# Patient Record
Sex: Male | Born: 1950 | Race: White | Hispanic: No | Smoking: Never smoker
Health system: Southern US, Community
[De-identification: ages and names within clinical notes are randomized; demographics above are authoritative.]

## PROBLEM LIST (undated history)

## (undated) DIAGNOSIS — I629 Nontraumatic intracranial hemorrhage, unspecified: Secondary | ICD-10-CM

## (undated) DIAGNOSIS — F1011 Alcohol abuse, in remission: Secondary | ICD-10-CM

## (undated) DIAGNOSIS — F418 Other specified anxiety disorders: Secondary | ICD-10-CM

## (undated) DIAGNOSIS — F0390 Unspecified dementia without behavioral disturbance: Secondary | ICD-10-CM

---

## 2020-09-25 ENCOUNTER — Emergency Department
Admission: EM | Admit: 2020-09-25 | Discharge: 2020-09-25 | Disposition: A | Discharge status: Home / Self Care | Payer: Medicare Other | Attending: Emergency Medicine | Admitting: Emergency Medicine

## 2020-09-25 ENCOUNTER — Emergency Department: Payer: Medicare Other

## 2020-09-25 ENCOUNTER — Other Ambulatory Visit: Payer: Self-pay

## 2020-09-25 DIAGNOSIS — Y92129 Unspecified place in nursing home as the place of occurrence of the external cause: Secondary | ICD-10-CM | POA: Diagnosis not present

## 2020-09-25 DIAGNOSIS — W050XXA Fall from non-moving wheelchair, initial encounter: Secondary | ICD-10-CM | POA: Diagnosis not present

## 2020-09-25 DIAGNOSIS — S0990XA Unspecified injury of head, initial encounter: Secondary | ICD-10-CM | POA: Diagnosis present

## 2020-09-25 DIAGNOSIS — W19XXXA Unspecified fall, initial encounter: Secondary | ICD-10-CM

## 2020-09-25 LAB — CBG MONITORING, ED: Glucose-Capillary: 105 mg/dL — ABNORMAL HIGH (ref 70–99)

## 2020-09-25 NOTE — ED Provider Notes (Signed)
[facility] Emergency Department Provider Note   ____________________________________________    I have reviewed the triage vital signs and the nursing notes.   HISTORY  Chief Complaint Fall  [facility] from [facility], history limited   HPI Brad Douglas is a 70 y.o. male who presents after reported fall from wheelchair.  EMS notes that nursing home staff reports the patient slipped out of his wheelchair and fell and may have hit his head.  The patient has no complaints.  He denies shoulder pain to me.  No abdominal pain chest pain back pain or neck pain.  He does not think he hit his head.  No nausea or vomiting.  No past medical history on file.  There are no problems to display for this patient.     Prior to Admission medications   Not on File     Allergies Patient has no known allergies.  No family history on file.  Social History    Review of Systems  Constitutional: No dizziness Eyes: No visual changes.  ENT: No epistaxis Cardiovascular: Denies chest pain. Respiratory: No cough Gastrointestinal: No abdominal pain.  No nausea, no vomiting.   Genitourinary: No groin injury Musculoskeletal: As above Skin: Negative for laceration Neurological: Negative for headaches    ____________________________________________   PHYSICAL EXAM:  VITAL SIGNS: ED Triage Vitals  Enc Vitals Group     BP 09/25/20 0358 112/66     Pulse Rate 09/25/20 0358 79     Resp 09/25/20 0358 13     Temp 09/25/20 0358 97.8 F (36.6 C)     Temp Source 09/25/20 0358 Rectal     SpO2 09/25/20 0358 98 %     Weight 09/25/20 0402 108.9 kg (240 lb)     Height 09/25/20 0402 1.956 m (6\' 5" )     Head Circumference --      Peak Flow --      Pain Score --      Pain Loc --      Pain Edu? --      Excl. in GC? --     Constitutional: Alert, no acute distress Eyes: Conjunctivae are normal.  Head: No hematoma noted, healing laceration, staples in  place Nose: No swelling or epistaxis Mouth/Throat: Mucous membranes are moist.   Neck:  Painless ROM, no pain with axial load, no vertebral tenderness to palpation Cardiovascular:  Grossly normal heart sounds.  Good peripheral circulation.  No chest wall tenderness palpation Respiratory: Normal respiratory effort.  No retractions. Lungs CTAB. Gastrointestinal: Soft and nontender. No distention.  No CVA tenderness.  Musculoskeletal: Normal range of motion of all extremities, no tenderness of the back or chest wall, no pain with axial load on both hips Neurologic:  Normal speech and language. No gross focal neurologic deficits are appreciated.  Skin:  Skin is warm, dry and intact. No rash noted. Psychiatric: Mood and affect are normal. Speech and behavior are normal.  ____________________________________________   LABS (all labs ordered are listed, but only abnormal results are displayed)  Labs Reviewed  CBG MONITORING, ED - Abnormal; Notable for the following components:      Result Value   Glucose-Capillary 105 (*)    All other components within normal limits   ____________________________________________  EKG  None ____________________________________________  RADIOLOGY  CT head reviewed by me, no acute abnormality ____________________________________________   PROCEDURES  Procedure(s) performed: No  Procedures   Critical Care performed: No ____________________________________________   INITIAL IMPRESSION /  ASSESSMENT AND PLAN / ED COURSE  Pertinent labs & imaging results that were available during my care of the patient were reviewed by me and considered in my medical decision making (see chart for details).  Patient presents after a fall as reported.  Exam is overall reassuring.  CT head is unremarkable  Removed 8 staples from old laceration patient had over a week ago, well-healed wound    ____________________________________________   FINAL CLINICAL  IMPRESSION(S) / ED DIAGNOSES  Final diagnoses:  Fall, initial encounter  Injury of head, initial encounter        Note:  This document was prepared using Dragon voice recognition software and may include unintentional dictation errors.   Jene Every, MD 09/25/20 [phone]

## 2020-09-25 NOTE — ED Triage Notes (Signed)
Pt via EMS from [facility]. Pt fell back out of his wheelchair. Pt hit the back of his head. EMS reports L shoulder pain and lower back pain. On arrival, pt has no complaints and able to move L shoulder freely. Pt did have some staples placed on the R posterior head. Pt is alert but only oriented self, situation at baseline.

## 2020-09-25 NOTE — ED Notes (Signed)
Discharge instructions given to brother who verbalized understanding.

## 2020-09-25 NOTE — ED Notes (Signed)
Pt presents to the ED after a mechanical fall. Pt sits in a reclining wheelchair and per staff the back of chair "gave out" and the pt fell backwards. Pt did hit the back of his head. Noted that pt has recent staples placed. Staples are intact. Per EMS, staff reported lower back pain and L shoulder pain. On assessment, Dr. Cyril Loosen performed full ROM and pt did not express any pain. Pt is alert but only oriented to self and situation at baseline.

## 2020-09-25 NOTE — Discharge Instructions (Addendum)
We removed the staples from an old scalp laceration that had healed nicely

## 2021-09-03 ENCOUNTER — Emergency Department: Payer: Medicare Other

## 2021-09-03 ENCOUNTER — Observation Stay
Admission: EM | Admit: 2021-09-03 | Discharge: 2021-09-03 | Disposition: A | Discharge status: Home / Self Care | Payer: Medicare Other | Attending: Internal Medicine | Admitting: Internal Medicine

## 2021-09-03 ENCOUNTER — Other Ambulatory Visit: Payer: Self-pay

## 2021-09-03 ENCOUNTER — Encounter: Payer: Self-pay | Admitting: Radiology

## 2021-09-03 DIAGNOSIS — R4182 Altered mental status, unspecified: Secondary | ICD-10-CM | POA: Diagnosis present

## 2021-09-03 DIAGNOSIS — Z20822 Contact with and (suspected) exposure to covid-19: Secondary | ICD-10-CM | POA: Diagnosis not present

## 2021-09-03 DIAGNOSIS — J90 Pleural effusion, not elsewhere classified: Secondary | ICD-10-CM | POA: Insufficient documentation

## 2021-09-03 DIAGNOSIS — G9341 Metabolic encephalopathy: Secondary | ICD-10-CM | POA: Diagnosis present

## 2021-09-03 DIAGNOSIS — F418 Other specified anxiety disorders: Secondary | ICD-10-CM | POA: Diagnosis not present

## 2021-09-03 DIAGNOSIS — S22010A Wedge compression fracture of first thoracic vertebra, initial encounter for closed fracture: Secondary | ICD-10-CM | POA: Diagnosis not present

## 2021-09-03 DIAGNOSIS — F0390 Unspecified dementia without behavioral disturbance: Secondary | ICD-10-CM | POA: Diagnosis present

## 2021-09-03 HISTORY — DX: Unspecified dementia, unspecified severity, without behavioral disturbance, psychotic disturbance, mood disturbance, and anxiety: F03.90

## 2021-09-03 HISTORY — DX: Other specified anxiety disorders: F41.8

## 2021-09-03 HISTORY — DX: Alcohol abuse, in remission: F10.11

## 2021-09-03 HISTORY — DX: Nontraumatic intracranial hemorrhage, unspecified: I62.9

## 2021-09-03 LAB — CBC WITH DIFFERENTIAL/PLATELET
Abs Immature Granulocytes: 0.02 10*3/uL (ref 0.00–0.07)
Basophils Absolute: 0 10*3/uL (ref 0.0–0.1)
Basophils Relative: 1 %
Eosinophils Absolute: 0 10*3/uL (ref 0.0–0.5)
Eosinophils Relative: 0 %
HCT: 42.7 % (ref 39.0–52.0)
Hemoglobin: 14.2 g/dL (ref 13.0–17.0)
Immature Granulocytes: 0 %
Lymphocytes Relative: 17 %
Lymphs Abs: 1.3 10*3/uL (ref 0.7–4.0)
MCH: 30.3 pg (ref 26.0–34.0)
MCHC: 33.3 g/dL (ref 30.0–36.0)
MCV: 91.2 fL (ref 80.0–100.0)
Monocytes Absolute: 1.4 10*3/uL — ABNORMAL HIGH (ref 0.1–1.0)
Monocytes Relative: 17 %
Neutro Abs: 5.3 10*3/uL (ref 1.7–7.7)
Neutrophils Relative %: 65 %
Platelets: 184 10*3/uL (ref 150–400)
RBC: 4.68 MIL/uL (ref 4.22–5.81)
RDW: 13.2 % (ref 11.5–15.5)
WBC: 8.1 10*3/uL (ref 4.0–10.5)
nRBC: 0 % (ref 0.0–0.2)

## 2021-09-03 LAB — URINALYSIS, ROUTINE W REFLEX MICROSCOPIC
Bacteria, UA: NONE SEEN
Bilirubin Urine: NEGATIVE
Glucose, UA: NEGATIVE mg/dL
Hgb urine dipstick: NEGATIVE
Leukocytes,Ua: NEGATIVE
Nitrite: NEGATIVE
Protein, ur: NEGATIVE mg/dL
Specific Gravity, Urine: 1.025 (ref 1.005–1.030)
pH: 5.5 (ref 5.0–8.0)

## 2021-09-03 LAB — TROPONIN I (HIGH SENSITIVITY): Troponin I (High Sensitivity): <2 ng/L (ref <18)

## 2021-09-03 LAB — LACTIC ACID, PLASMA
Lactic Acid, Venous: 1.1 mmol/L (ref 0.5–1.9)
Lactic Acid, Venous: 0.9 mmol/L (ref 0.5–1.9)

## 2021-09-03 LAB — COMPREHENSIVE METABOLIC PANEL
ALT: 13 U/L (ref 0–44)
AST: 16 U/L (ref 15–41)
Albumin: 3.8 g/dL (ref 3.5–5.0)
Alkaline Phosphatase: 62 U/L (ref 38–126)
Anion gap: 7 (ref 5–15)
BUN: 21 mg/dL (ref 8–23)
CO2: 25 mmol/L (ref 22–32)
Calcium: 9.2 mg/dL (ref 8.9–10.3)
Chloride: 106 mmol/L (ref 98–111)
Creatinine, Ser: 1.06 mg/dL (ref 0.61–1.24)
GFR, Estimated: >60 mL/min (ref >60)
Glucose, Bld: 102 mg/dL — ABNORMAL HIGH (ref 70–99)
Potassium: 3.7 mmol/L (ref 3.5–5.1)
Sodium: 138 mmol/L (ref 135–145)
Total Bilirubin: 0.7 mg/dL (ref 0.3–1.2)
Total Protein: 6.7 g/dL (ref 6.5–8.1)

## 2021-09-03 LAB — TSH: TSH: 1.55 u[IU]/mL (ref 0.350–4.500)

## 2021-09-03 LAB — RESP PANEL BY RT-PCR (FLU A&B, COVID) ARPGX2
Influenza A by PCR: NEGATIVE
Influenza B by PCR: NEGATIVE
SARS Coronavirus 2 by RT PCR: NEGATIVE

## 2021-09-03 MED ORDER — ENOXAPARIN SODIUM 40 MG/0.4ML IJ SOSY: 40.0000 mg | PREFILLED_SYRINGE | INTRAMUSCULAR | Status: DC

## 2021-09-03 MED ORDER — ACETAMINOPHEN 325 MG PO TABS: 650.0000 mg | ORAL_TABLET | Freq: Four times a day (QID) | ORAL | Status: DC | PRN

## 2021-09-03 MED ORDER — LORAZEPAM 2 MG/ML IJ SOLN: 1.0000 mg | INTRAMUSCULAR | Status: DC | PRN

## 2021-09-03 MED ORDER — ONDANSETRON HCL 4 MG/2ML IJ SOLN: 4.0000 mg | Freq: Three times a day (TID) | INTRAMUSCULAR | Status: DC | PRN

## 2021-09-03 MED ORDER — IOHEXOL 300 MG/ML  SOLN
75.0000 mL | Freq: Once | INTRAMUSCULAR | Status: AC | PRN
  Administered 2021-09-03: 75 mL via INTRAVENOUS

## 2021-09-03 NOTE — ED Provider Notes (Signed)
[facility] Provider Note    Event Date/Time   First MD Initiated Contact with Patient 09/03/21 [phone]     (approximate)   History   Altered Mental Status   HPI  Brad Douglas is a 71 y.o. male who comes in for increased confusion and some coughing.  His brother is with him and provides most of the history.  Brother reports the patient is much more confused than usual.  Patient has a history of seizures and anterior cranial hemorrhage apparently after a fall.      Physical Exam   Triage Vital Signs: ED Triage Vitals  Enc Vitals Group     BP 09/03/21 0933 (!) 122/50     Pulse Rate 09/03/21 0929 95     Resp 09/03/21 0929 19     Temp 09/03/21 0933 98.2 F (36.8 C)     Temp Source 09/03/21 0933 Oral     SpO2 09/03/21 0929 96 %     Weight 09/03/21 0926 200 lb (90.7 kg)     Height 09/03/21 0926 6\' 2"  (1.88 m)     Head Circumference --      Peak Flow --      Pain Score 09/03/21 0925 0     Pain Loc --      Pain Edu? --      Excl. in GC? --     Most recent vital signs: Vitals:   09/03/21 1200 09/03/21 1230  BP: 126/87 (!) 153/79  Pulse:    Resp:  16  Temp:    SpO2:  96%     General: Awake, alert, no distress.  But confused.  Only appears to be oriented to person. CV:  Good peripheral perfusion.  Resp:  Normal effort.  Lungs are clear good respiratory effort Abd:  No distention.  Soft and nontender no organomegaly Extremities: No edema   ED Results / Procedures / Treatments   Labs (all labs ordered are listed, but only abnormal results are displayed) Labs Reviewed  COMPREHENSIVE METABOLIC PANEL - Abnormal; Notable for the following components:      Result Value   Glucose, Bld 102 (*)    All other components within normal limits  CBC WITH DIFFERENTIAL/PLATELET - Abnormal; Notable for the following components:   Monocytes Absolute 1.4 (*)    All other components within normal limits  URINALYSIS, ROUTINE W REFLEX MICROSCOPIC -  Abnormal; Notable for the following components:   Ketones, ur TRACE (*)    All other components within normal limits  RESP PANEL BY RT-PCR (FLU A&B, COVID) ARPGX2  LACTIC ACID, PLASMA  LACTIC ACID, PLASMA  TSH  TROPONIN I (HIGH SENSITIVITY)  TROPONIN I (HIGH SENSITIVITY)     EKG  EKG read interpreted by me shows normal sinus rhythm rate of 73 left axis no acute ST-T wave changes are seen.  There is at least 4 PVCs present and the pattern appears to be trigeminy.   RADIOLOGY CT read by radiology reviewed by me shows a slight increase in chronic density in brain and apparent left maxillary sinusitis.  PROCEDURES:  Critical Care performed: Critical care time 20 minutes.  This includes talking with the patient's brother several times interviewing and examining the patient several times for example after the CT of the head showed a maxillary sinusitis.  I reviewed all of his studies and some of his old records.  Additionally I discussed him with the hospitalist.  Procedures   MEDICATIONS ORDERED IN  ED: Medications  iohexol (OMNIPAQUE) 300 MG/ML solution 75 mL (75 mLs Intravenous Contrast Given 09/03/21 1255)     IMPRESSION / MDM / ASSESSMENT AND PLAN / ED COURSE  I reviewed the triage vital signs and the nursing notes. Patient's electrolytes are okay his troponin is negative lactic acid is negative white count is normal with essentially normal differential flu and SARS are negative his urine, in spite of being very cloudy is clear for any signs of infection.  CT of the head shows maxillary sinus air-fluid level.  His maxillary sinuses are not tender however.  Chest x-ray showed a possible nodule but CT did not show anything going on in his lungs except for some linear streaky atelectasis and a small pericardial effusion.  He also had a T1 vertebral fracture but he does not have any pain in that area.  I am not sure why the patient's mental status is altered but we will get him in the  hospital.  There is no sign of liver function causing the altered mental status or lung or urine infection or anything else frankly.  Patient's physical is essentially negative. he patient is on the cardiac monitor to evaluate for evidence of arrhythmia and/or significant heart rate changes.  None were seen        FINAL CLINICAL IMPRESSION(S) / ED DIAGNOSES   Final diagnoses:  Altered mental status, unspecified altered mental status type     Rx / DC Orders   ED Discharge Orders     None        Note:  This document was prepared using Dragon voice recognition software and may include unintentional dictation errors.   Arnaldo Natal, MD 09/03/21 [phone]

## 2021-09-03 NOTE — ED Notes (Signed)
Pts brother provided transportation back to [facility]

## 2021-09-03 NOTE — Consult Note (Signed)
Medical Consultation   Brad Douglas  [id number]  DOB: 12/25/1950  DOA: 09/03/2021  PCP: Patient, No Pcp Per (Inactive)   Outpatient Specialists:    Requesting physician: Dr. Tamala Julian of Ed  Reason for consultation: -AMS  History of Present Illness: Brad Douglas is an 71 y.o. male with PMH of dementia, depression, anxiety, intracranial hemorrhage after fall, COVID infection, alcohol abuse in remission, who presents with altered mental status.  Per his brother at the bedside, pt is from nursing home memory unit.  Patient has a history of dementia.  At his normal baseline, patient is not oriented to time and place.  Occasionally recognize his brother.  Per report, patient was noted to be more confused and being aggressive using profanity in the facility.  He is sent to hospital for further evaluation and treatment.  Per his brother, patient does not have chest pain, cough, shortness breath.  No fever or chills.  No nausea, vomiting, diarrhea or abdominal pain noted.  He moves all extremities normally.  Images showed T1 compression fracture in ED, but the patient does not have any back pain.  Lab, image and vitals: WBC 8.1, troponin negative, negative COVID PCR, electrolytes renal function okay, temperature normal, blood pressure 153/79, heart rate 95, 91, RR 19, oxygen saturation 96% on room air.  CT head negative for acute intracranial abnormalities.  Chest x-ray with questionable 1.5 cm oval opacity in the right apex, but CT scan of the chest did not show no discrete lung nodule in the right apical region.  CT-chest: There are small linear patchy densities in the lower lung fields, more so on the right side suggesting scarring or subsegmental atelectasis. There is no demonstrable discrete lung nodule in the right apical region.   Cardiomegaly. Coronary artery calcifications are seen. Small pericardial effusion.   There is 50-60% decrease in height of  body of T1 vertebra suggesting recent or old compression fracture. Coarse trabeculae in the body of T1 vertebra suggest possible hemangioma.  Review of Systems: Could not reviewed accurately due to dementia.  Past Medical History: Past Medical History:  Diagnosis Date   Alcohol abuse, in remission    Dementia ([place])    Depression with anxiety    Intracranial hemorrhage ([place])     Past Surgical History: History reviewed. No pertinent surgical history.  Allergies:  No Known Allergies   Social History:  reports that he has never smoked. He has never used smokeless tobacco. He reports that he does not currently use alcohol. He reports that he does not use drugs.  Family History: Family History  Problem Relation Age of Onset   Dementia Mother    Diabetes Brother      Physical Exam: Vitals:   09/03/21 1127 09/03/21 1130 09/03/21 1200 09/03/21 1230  BP: (!) 141/79 140/78 126/87 (!) 153/79  Pulse: 90 91    Resp:    16  Temp:      TempSrc:      SpO2: 100% 100%  96%  Weight:      Height:         General: Not in acute distress HEENT:       Eyes: PERRL, EOMI, no scleral icterus.       ENT: No discharge from the ears and nose       Neck: No JVD, no bruit, no mass felt. Heme: No neck lymph node enlargement. Cardiac: S1/S2,  RRR, No murmurs, No gallops or rubs. Respiratory: No rales, wheezing, rhonchi or rubs. GI: Soft, nondistended, nontender, no organomegaly, BS present. GU: No hematuria Ext: No pitting leg edema bilaterally. 1+DP/PT pulse bilaterally. Musculoskeletal: No joint deformities, No joint redness or warmth, no limitation of ROM in spin. Skin: No rashes.  Neuro: Alert, patient seems to recognize his brother, but not oriented to the place and time.  Cranial nerves II-XII grossly intact, moves all extremities normally. Psych: Patient is not psychotic, no suicidal or hemocidal ideation.  Patient is calm when I saw patient in ED    Data reviewed:  I have  personally reviewed following labs and imaging studies Labs:  CBC: Recent Labs  Lab 09/03/21 0930  WBC 8.1  NEUTROABS 5.3  HGB 14.2  HCT 42.7  MCV 91.2  PLT [id number]    Basic Metabolic Panel: Recent Labs  Lab 09/03/21 0930  NA 138  K 3.7  CL 106  CO2 25  GLUCOSE 102*  BUN 21  CREATININE 1.06  CALCIUM 9.2   GFR Estimated Creatinine Clearance: 74.3 mL/min (by C-G formula based on SCr of 1.06 mg/dL). Liver Function Tests: Recent Labs  Lab 09/03/21 0930  AST 16  ALT 13  ALKPHOS 62  BILITOT 0.7  PROT 6.7  ALBUMIN 3.8   No results for input(s): LIPASE, AMYLASE in the last 168 hours. No results for input(s): AMMONIA in the last 168 hours. Coagulation profile No results for input(s): INR, PROTIME in the last 168 hours.  Cardiac Enzymes: No results for input(s): CKTOTAL, CKMB, CKMBINDEX, TROPONINI in the last 168 hours. BNP: Invalid input(s): POCBNP CBG: No results for input(s): GLUCAP in the last 168 hours. D-Dimer No results for input(s): DDIMER in the last 72 hours. Hgb A1c No results for input(s): HGBA1C in the last 72 hours. Lipid Profile No results for input(s): CHOL, HDL, LDLCALC, TRIG, CHOLHDL, LDLDIRECT in the last 72 hours. Thyroid function studies Recent Labs    09/03/21 1446  TSH 1.550   Anemia work up No results for input(s): VITAMINB12, FOLATE, FERRITIN, TIBC, IRON, RETICCTPCT in the last 72 hours. Urinalysis    Component Value Date/Time   COLORURINE YELLOW 09/03/2021 0930   APPEARANCEUR CLEAR 09/03/2021 0930   LABSPEC 1.025 09/03/2021 0930   PHURINE 5.5 09/03/2021 0930   GLUCOSEU NEGATIVE 09/03/2021 0930   HGBUR NEGATIVE 09/03/2021 0930   BILIRUBINUR NEGATIVE 09/03/2021 0930   KETONESUR TRACE (A) 09/03/2021 0930   PROTEINUR NEGATIVE 09/03/2021 0930   NITRITE NEGATIVE 09/03/2021 0930   LEUKOCYTESUR NEGATIVE 09/03/2021 0930     Microbiology Recent Results (from the past 240 hour(s))  Resp Panel by RT-PCR (Flu A&B, Covid)  Nasopharyngeal Swab     Status: None   Collection Time: 09/03/21  9:52 AM   Specimen: Nasopharyngeal Swab; Nasopharyngeal(NP) swabs in vial transport medium  Result Value Ref Range Status   SARS Coronavirus 2 by RT PCR NEGATIVE NEGATIVE Final    Comment: (NOTE) SARS-CoV-2 target nucleic acids are NOT DETECTED.  The SARS-CoV-2 RNA is generally detectable in upper respiratory specimens during the acute phase of infection. The lowest concentration of SARS-CoV-2 viral copies this assay can detect is 138 copies/mL. A negative result does not preclude SARS-Cov-2 infection and should not be used as the sole basis for treatment or other patient management decisions. A negative result may occur with  improper specimen collection/handling, submission of specimen other than nasopharyngeal swab, presence of viral mutation(s) within the areas targeted by this assay, and inadequate number of  viral copies(<138 copies/mL). A negative result must be combined with clinical observations, patient history, and epidemiological information. The expected result is Negative.  Fact Sheet for Patients:  EntrepreneurPulse.com.au  Fact Sheet for Healthcare Providers:  IncredibleEmployment.be  This test is no t yet approved or cleared by the [place] FDA and  has been authorized for detection and/or diagnosis of SARS-CoV-2 by FDA under an Emergency Use Authorization (EUA). This EUA will remain  in effect (meaning this test can be used) for the duration of the COVID-19 declaration under Section 564(b)(1) of the Act, 21 U.S.C.section 360bbb-3(b)(1), unless the authorization is terminated  or revoked sooner.       Influenza A by PCR NEGATIVE NEGATIVE Final   Influenza B by PCR NEGATIVE NEGATIVE Final    Comment: (NOTE) The Xpert Xpress SARS-CoV-2/FLU/RSV plus assay is intended as an aid in the diagnosis of influenza from Nasopharyngeal swab specimens and should not be  used as a sole basis for treatment. Nasal washings and aspirates are unacceptable for Xpert Xpress SARS-CoV-2/FLU/RSV testing.  Fact Sheet for Patients: EntrepreneurPulse.com.au  Fact Sheet for Healthcare Providers: IncredibleEmployment.be  This test is not yet approved or cleared by the [place] FDA and has been authorized for detection and/or diagnosis of SARS-CoV-2 by FDA under an Emergency Use Authorization (EUA). This EUA will remain in effect (meaning this test can be used) for the duration of the COVID-19 declaration under Section 564(b)(1) of the Act, 21 U.S.C. section 360bbb-3(b)(1), unless the authorization is terminated or revoked.  Performed at [facility], [address] [place], [place] [address]        Inpatient Medications:   Scheduled Meds:  enoxaparin (LOVENOX) injection  40 mg Subcutaneous Q24H   Continuous Infusions:   Radiological Exams on Admission: CT Head Wo Contrast  Result Date: 09/03/2021 CLINICAL DATA:  Altered mental status EXAM: CT HEAD WITHOUT CONTRAST TECHNIQUE: Contiguous axial images were obtained from the base of the skull through the vertex without intravenous contrast. RADIATION DOSE REDUCTION: This exam was performed according to the departmental dose-optimization program which includes automated exposure control, adjustment of the mA and/or kV according to patient size and/or use of iterative reconstruction technique. COMPARISON:  CT head 09/25/2020 FINDINGS: Brain: No acute intracranial hemorrhage, mass effect, or herniation. No extra-axial fluid collections. No evidence of acute territorial infarct. No hydrocephalus. Moderate cortical volume loss. Extensive hypodensities throughout the periventricular and subcortical white matter, likely secondary to chronic microvascular ischemic changes. Chronic round 8 mm hyperdense focus in the right corona radiata, measured 7 mm on previous study.  Vascular: No hyperdense vessel or unexpected calcification. Skull: Normal. Negative for fracture or focal lesion. Sinuses/Orbits: Small fluid level partially seen in the left maxillary sinus. Mild opacification in the left mastoid air cells. Other: None. IMPRESSION: 1. No acute intracranial process identified. 2. Chronic microvascular ischemic changes, cortical volume loss and chronic focal hyperdensity in the right corona radiata. 3. Correlate for possible acute left maxillary sinusitis. Mild opacification in the left mastoid air cells. Electronically Signed   By: Ofilia Neas M.D.   On: 09/03/2021 10:10   CT Chest W Contrast  Result Date: 09/03/2021 CLINICAL DATA:  Right lung nodule seen in the chest radiographs done earlier today EXAM: CT CHEST WITH CONTRAST TECHNIQUE: Multidetector CT imaging of the chest was performed during intravenous contrast administration. RADIATION DOSE REDUCTION: This exam was performed according to the departmental dose-optimization program which includes automated exposure control, adjustment of the mA and/or kV according to patient  size and/or use of iterative reconstruction technique. CONTRAST:  48mL OMNIPAQUE IOHEXOL 300 MG/ML  SOLN COMPARISON:  Chest radiograph done earlier today FINDINGS: Cardiovascular: Coronary artery calcifications are seen. Heart is enlarged in size. Small pericardial effusion is seen. There is homogeneous enhancement in thoracic aorta. There are no filling defects in main pulmonary arteries in the mediastinum. Mediastinum/Nodes: No significant lymphadenopathy seen. Lungs/Pleura: There are small linear patchy densities in the lower lung fields. There is no discrete nodule in the right apical lung fields. 1.5 cm high density structure seen in the right apex in the chest radiographs could not be distinctly identified in the lung fields. There are scattered arterial calcifications in the right subclavian. There is no pleural effusion or pneumothorax.  Upper Abdomen: Unremarkable. Musculoskeletal: Degenerative changes are noted in thoracic spine and visualized lower cervical spine. There is a proximally 50-60% decrease in height of anterior aspect of body of T1 vertebra. There is inhomogeneous attenuation in the body of T1 vertebra with coarse trabeculae. There is previous surgical fusion in the lower cervical spine. IMPRESSION: There are small linear patchy densities in the lower lung fields, more so on the right side suggesting scarring or subsegmental atelectasis. There is no demonstrable discrete lung nodule in the right apical region. Cardiomegaly. Coronary artery calcifications are seen. Small pericardial effusion. There is 50-60% decrease in height of body of T1 vertebra suggesting recent or old compression fracture. Coarse trabeculae in the body of T1 vertebra suggest possible hemangioma. Electronically Signed   By: Elmer Picker M.D.   On: 09/03/2021 13:29   DG Chest Portable 1 View  Result Date: 09/03/2021 CLINICAL DATA:  Change in mental status EXAM: PORTABLE CHEST 1 VIEW COMPARISON:  None. FINDINGS: Approximately 1.5 cm oval opacity at the right lung apex. Otherwise, no consolidation. No visible pleural effusions or pneumothorax. Cardiomediastinal silhouette is within normal limits. IMPRESSION: Approximately 1.5 cm oval opacity at the right lung apex. Recommend CT chest to exclude pulmonary nodule/malignancy. Electronically Signed   By: Margaretha Sheffield M.D.   On: 09/03/2021 10:31    Impression/Recommendations Principal Problem:   Acute metabolic encephalopathy Active Problems:   Depression with anxiety   Dementia (HCC)   Compression fracture of T1 vertebra (HCC)  Acute metabolic encephalopathy: Etiology is not clear.  May be due to delirium.  CT scan of head is negative.  Troponin negative.  Urinalysis negative.  No signs of infection.  Per his brother, patient's mental status has come back to baseline.  Currently patient is calm,  not aggressive.  Brother strongly wants patient go back to facility, which I think  is reasonable.  Discussed with ED physician, patient will be discharged back to nursing home memory unit.  Depression with anxiety -Continue home medications  Dementia ([place]) -Mental status, back to baseline.  Compression fracture of T1 vertebra ([place]): No back pain. -May consider as needed Tylenol   Thank you for this consultation.  Our [facility] hospitalist team will follow the patient with you.   Time Spent: 35 min  Ivor Costa M.D. [facility] 09/03/2021, 5:41 PM

## 2021-09-03 NOTE — ED Triage Notes (Signed)
BIB EMS from [facility]. Pt in memory care unit. 1610 staff noticed patient change in behaviour. Pt is ambulatory normally. He is more aggressive and using profanity.  Vitals 147/72 94 96% on RA BGL 128

## 2022-01-03 ENCOUNTER — Encounter: Payer: Self-pay | Admitting: Emergency Medicine

## 2022-01-03 ENCOUNTER — Emergency Department
Admission: EM | Admit: 2022-01-03 | Discharge: 2022-01-04 | Disposition: A | Discharge status: Skilled Nursing Facility | Payer: Medicare Other | Attending: Emergency Medicine | Admitting: Emergency Medicine

## 2022-01-03 ENCOUNTER — Other Ambulatory Visit: Payer: Self-pay

## 2022-01-03 DIAGNOSIS — R338 Other retention of urine: Secondary | ICD-10-CM

## 2022-01-03 DIAGNOSIS — F03911 Unspecified dementia, unspecified severity, with agitation: Secondary | ICD-10-CM | POA: Diagnosis not present

## 2022-01-03 DIAGNOSIS — F0390 Unspecified dementia without behavioral disturbance: Secondary | ICD-10-CM

## 2022-01-03 DIAGNOSIS — R4182 Altered mental status, unspecified: Secondary | ICD-10-CM | POA: Diagnosis present

## 2022-01-03 DIAGNOSIS — R339 Retention of urine, unspecified: Secondary | ICD-10-CM | POA: Diagnosis not present

## 2022-01-03 LAB — BASIC METABOLIC PANEL
Anion gap: 6 (ref 5–15)
BUN: 22 mg/dL (ref 8–23)
CO2: 26 mmol/L (ref 22–32)
Calcium: 9.5 mg/dL (ref 8.9–10.3)
Chloride: 108 mmol/L (ref 98–111)
Creatinine, Ser: 0.88 mg/dL (ref 0.61–1.24)
GFR, Estimated: >60 mL/min (ref >60)
Glucose, Bld: 91 mg/dL (ref 70–99)
Potassium: 4 mmol/L (ref 3.5–5.1)
Sodium: 140 mmol/L (ref 135–145)

## 2022-01-03 LAB — CBC WITH DIFFERENTIAL/PLATELET
Abs Immature Granulocytes: 0.02 10*3/uL (ref 0.00–0.07)
Basophils Absolute: 0.1 10*3/uL (ref 0.0–0.1)
Basophils Relative: 1 %
Eosinophils Absolute: 0.4 10*3/uL (ref 0.0–0.5)
Eosinophils Relative: 4 %
HCT: 45 % (ref 39.0–52.0)
Hemoglobin: 15 g/dL (ref 13.0–17.0)
Immature Granulocytes: 0 %
Lymphocytes Relative: 25 %
Lymphs Abs: 2.4 10*3/uL (ref 0.7–4.0)
MCH: 29.5 pg (ref 26.0–34.0)
MCHC: 33.3 g/dL (ref 30.0–36.0)
MCV: 88.4 fL (ref 80.0–100.0)
Monocytes Absolute: 1 10*3/uL (ref 0.1–1.0)
Monocytes Relative: 10 %
Neutro Abs: 5.8 10*3/uL (ref 1.7–7.7)
Neutrophils Relative %: 60 %
Platelets: 206 10*3/uL (ref 150–400)
RBC: 5.09 MIL/uL (ref 4.22–5.81)
RDW: 12.9 % (ref 11.5–15.5)
WBC: 9.6 10*3/uL (ref 4.0–10.5)
nRBC: 0 % (ref 0.0–0.2)

## 2022-01-03 LAB — URINALYSIS, ROUTINE W REFLEX MICROSCOPIC
Bilirubin Urine: NEGATIVE
Glucose, UA: NEGATIVE mg/dL
Hgb urine dipstick: NEGATIVE
Ketones, ur: NEGATIVE mg/dL
Leukocytes,Ua: NEGATIVE
Nitrite: NEGATIVE
Protein, ur: NEGATIVE mg/dL
Specific Gravity, Urine: 1.02 (ref 1.005–1.030)
pH: 5 (ref 5.0–8.0)

## 2022-01-03 NOTE — ED Notes (Signed)
  Report called to [facility].
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
## 2022-01-03 NOTE — ED Notes (Signed)
Called [facility] for transport back to [facility] 

## 2022-01-03 NOTE — ED Provider Notes (Signed)
[facility] Provider Note    Event Date/Time   First MD Initiated Contact with Patient 01/03/22 2045     (approximate)   History   Chief Complaint: Altered Mental Status and Aggressive Behavior   HPI  Brad Douglas is a 71 y.o. male with a history of chronic dementia who was sent to the ED from his memory care unit due to increased agitation today.  Nursing home reported a concern for UTI by EMS feels like patient is at his previous baseline compared to their previous experiences with him.  The patient to me denies any pain and states that he feels fine.  He is very pleasant to interact with.  He is eager to go back home.     Physical Exam   Triage Vital Signs: ED Triage Vitals [01/03/22 2045]  Enc Vitals Group     BP 132/74     Pulse Rate 64     Resp 16     Temp 98 F (36.7 C)     Temp Source Oral     SpO2 100 %     Weight 200 lb (90.7 kg)     Height 6\' 2"  (1.88 m)     Head Circumference      Peak Flow      Pain Score 0     Pain Loc      Pain Edu?      Excl. in GC?     Most recent vital signs: Vitals:   01/03/22 2237 01/03/22 2307  BP: 134/76 117/78  Pulse: 73 87  Resp: 17 17  Temp:    SpO2: 99% 99%    General: Awake, no distress.  CV:  Good peripheral perfusion.  Regular rate rhythm Resp:  Normal effort.  Clear to auscultation bilaterally Abd:  No distention.  Soft and nontender Other:  No rash.  Moist oral mucosa.  Pleasant affect   ED Results / Procedures / Treatments   Labs (all labs ordered are listed, but only abnormal results are displayed) Labs Reviewed  URINALYSIS, ROUTINE W REFLEX MICROSCOPIC - Abnormal; Notable for the following components:      Result Value   Color, Urine YELLOW (*)    APPearance CLEAR (*)    All other components within normal limits  BASIC METABOLIC PANEL  CBC WITH DIFFERENTIAL/PLATELET    EKG Interpreted by me Sinus rhythm, rate of 68.  Normal axis, first-degree AV block.  Poor  R wave progression.  Normal ST segments and T waves.   RADIOLOGY    PROCEDURES:  Procedures   MEDICATIONS ORDERED IN ED: Medications - No data to display   IMPRESSION / MDM / ASSESSMENT AND PLAN / ED COURSE  I reviewed the triage vital signs and the nursing notes.                              Differential diagnosis includes, but is not limited to, dehydration, renal insufficiency, electrolyte abnormality, anemia, UTI  Patient's presentation is most consistent with acute illness / injury with system symptoms.  Patient presents with agitation.  He denies any symptoms but his ability to report his HPI is complicated by his dementia.  Vital signs are normal, exam is nonfocal.  Serum labs and urinalysis are normal.  To obtain a urine sample, coud catheter was required.  It was found that patient had about 600 mL of urine in his bladder indicative  of a urinary retention, so coud Foley catheter was left in place.  He will be discharged to urology follow-up, stable for return to his memory care.       FINAL CLINICAL IMPRESSION(S) / ED DIAGNOSES   Final diagnoses:  Acute urinary retention  Chronic dementia (HCC)     Rx / DC Orders   ED Discharge Orders     None        Note:  This document was prepared using Dragon voice recognition software and may include unintentional dictation errors.   Sharman Cheek, MD 01/03/22 [phone]

## 2022-01-03 NOTE — Discharge Instructions (Signed)
Your serum labs and urine test today were normal.  He did have urinary retention with a bladder volume of 600 mL so we placed a Foley catheter.  Please follow-up with urology.

## 2022-01-15 NOTE — Progress Notes (Signed)
01/16/22 9:53 AM   Brad Douglas February 20, 1951 [id number]  Referring provider:  No referring provider defined for this encounter. Chief Complaint  Patient presents with   Urinary Retention     HPI: Brad Douglas is a 71 y.o.male who presents today for further evaluation of urinary retention with foley catheter.   He was seen in the ED on 01/03/2022. He presented with concern for UTI however his serum labs and urinalysis are normal.  To obtain a urine sample, coud catheter was required.  It was found that patient had about 600 mL of urine in his bladder indicative of a urinary retention, so coud Foley catheter was left in place.   He does have baseline chronic dementia.  At the time of ER visit, his caretakers felt like he was not at his previous baseline.  Unfortunately, the patient is very poor historian.  He is accompanied today by his brother who is his POA.  His brother is unable to provide much additional history.  He reports that his brother is in fact at his baseline.  His Foley bag is now slightly pink-tinged and cloudy, concerning for possible underlying UTI.  No known personal history of urinary retention.  Recent PSA data.  He did have a CT scan on 08/2020 at [facility] which showed prostamegaly with dystrophic prostate calcifications.  Bladder was normal.  Kidneys were normal.  PMH: Past Medical History:  Diagnosis Date   Alcohol abuse, in remission    Dementia (HCC)    Depression with anxiety    Intracranial hemorrhage (HCC)     Surgical History: History reviewed. No pertinent surgical history.  Home Medications:  Allergies as of 01/16/2022   No Known Allergies      Medication List        Accurate as of January 16, 2022  9:53 AM. If you have any questions, ask your nurse or doctor.          STOP taking these medications    traZODone 50 MG tablet Commonly known as: DESYREL Stopped by: Vanna Scotland, MD       TAKE these  medications    cephALEXin 500 MG capsule Commonly known as: Keflex Take 1 capsule (500 mg total) by mouth 3 (three) times daily. Started by: Vanna Scotland, MD   LORazepam 0.5 MG tablet Commonly known as: ATIVAN Take 0.5 mg by mouth 2 (two) times daily as needed for agitation.   melatonin 3 MG Tabs tablet Take 3 mg by mouth at bedtime.   polyethylene glycol powder 17 GM/SCOOP powder Commonly known as: GLYCOLAX/MIRALAX Take 17 g by mouth daily.   risperiDONE 0.5 MG tablet Commonly known as: RISPERDAL Take 0.25 mg by mouth at bedtime.   senna-docusate 8.6-50 MG tablet Commonly known as: Senokot-S Take 2 tablets by mouth 2 (two) times daily.   sertraline 50 MG tablet Commonly known as: ZOLOFT Take 50 mg by mouth daily.   traMADol 50 MG tablet Commonly known as: ULTRAM Take 50 mg by mouth 3 (three) times daily as needed.        Allergies:  No Known Allergies  Family History: Family History  Problem Relation Age of Onset   Dementia Mother    Diabetes Brother     Social History:  reports that he has never smoked. He has never used smokeless tobacco. He reports that he does not currently use alcohol. He reports that he does not use drugs.   Physical Exam: BP 124/72  Ht 6\' 2"  (1.88 m)   BMI 25.68 kg/m   Constitutional:  Alert but not oriented, mumbling without any meaningful language. HEENT: [place] AT, moist mucus membranes.  Trachea midline, no masses. Cardiovascular: No clubbing, cyanosis, or edema. Respiratory: Normal respiratory effort, no increased work of breathing. Skin: No rashes, bruises or suspicious lesions. Neurologic: Grossly intact, no focal deficits, moving all 4 extremities. Psychiatric: Normal mood and affect.  Laboratory Data:  Lab Results  Component Value Date   CREATININE 0.88 01/03/2022    Assessment & Plan:    1.  Urinary retention Incidental in the emergency room without infection is precipitating factor, unclear whether he was  symptomatic at the time as well as unclear chronicity  We remove the Foley catheter today and he stayed until he is able to void which is reassuring  Given that he does not seem to be bothered, will defer treating with additional medications but consider Flomax if he is still not emptying to completion at his follow-up  We will also consider getting a baseline PSA primarily to rule out advanced metastatic prostate cancer although not suspected, would not intervene for mild or moderately elevated PSA   2.  Possible UTI Urine appears cloudy today, suspect possible catheter related UTI  In light of other comorbidities and inability to communicate in a meaningful way, I recommended going ahead and treating prophylactically with Keflex 500 mg for 5 days, he was given a single dose of Keflex at the time of Foley removal today   Follow-up in 2 weeks for PVR/PSA  03/05/2022 as a scribe for Tawni Millers, MD.,have documented all relevant documentation on the behalf of Vanna Scotland, MD,as directed by  Vanna Scotland, MD while in the presence of Vanna Scotland, MD.  I have reviewed the above documentation for accuracy and completeness, and I agree with the above.   Vanna Scotland, MD    [facility] [address], [address] [place], [place] [phone]

## 2022-01-16 ENCOUNTER — Encounter: Payer: Self-pay | Admitting: Urology

## 2022-01-16 ENCOUNTER — Ambulatory Visit (INDEPENDENT_AMBULATORY_CARE_PROVIDER_SITE_OTHER): Admitting: Urology

## 2022-01-16 VITALS — BP 124/72 | Ht 74.0 in

## 2022-01-16 DIAGNOSIS — R82998 Other abnormal findings in urine: Secondary | ICD-10-CM

## 2022-01-16 DIAGNOSIS — R339 Retention of urine, unspecified: Secondary | ICD-10-CM | POA: Diagnosis not present

## 2022-01-16 LAB — BLADDER SCAN AMB NON-IMAGING: Scan Result: 35

## 2022-01-16 MED ORDER — CEPHALEXIN 500 MG PO CAPS: 500.0000 mg | ORAL_CAPSULE | Freq: Three times a day (TID) | ORAL | 0 refills | Status: AC

## 2022-01-16 MED ORDER — CEPHALEXIN 250 MG PO CAPS
500.0000 mg | ORAL_CAPSULE | Freq: Once | ORAL | Status: AC
  Administered 2022-01-16: 500 mg via ORAL

## 2022-01-16 NOTE — Progress Notes (Signed)
Fill and Pull Catheter Removal  Patient is present today for a catheter removal.  Patient was cleaned and prepped in a sterile fashion 6ml of sterile water/ saline was instilled into the bladder  29ml of water was then drained from the balloon.  A 16FR foley cath was removed from the bladder complications were noted as: Patient became agitated and combative in the process of filling bladder with water  .  Patient as then given some time to void on their own.Patient can void.  Performed by: Milas Kocher, CMA

## 2022-01-16 NOTE — Addendum Note (Signed)
Addended by: Milas Kocher A on: 01/16/2022 11:32 AM   Modules accepted: Orders

## 2022-01-24 ENCOUNTER — Telehealth: Payer: Self-pay | Admitting: Physician Assistant

## 2022-01-24 NOTE — Telephone Encounter (Signed)
Nidal's brother, Orlondo Holycross stopped in to let us know that Aleksa is under the care of [facility]osp[facility]. We did cancel is upcoming appt.  Perlie Gold just wanted you to be aware.

## 2022-01-30 ENCOUNTER — Ambulatory Visit: Payer: Medicare Other | Admitting: Physician Assistant

## 2022-02-02 DEATH — deceased

## 2022-10-17 IMAGING — CT CT HEAD W/O CM
4 of 8 series · 17 of 47 positions shown, 18 images · non-contrast
Comparison: CT head 09/25/2020

CLINICAL DATA: Altered mental status



[Series 2: head bone · axial · 0.42mm/px · z∈[-145,-1]mm · 8 of 84 slices shown]
[im 6/84  bone]
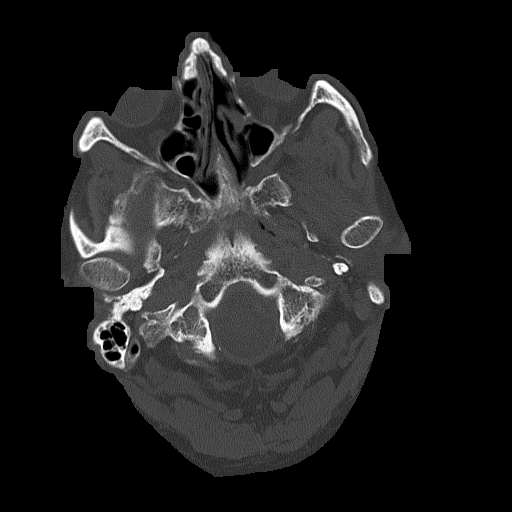
[im 16/84  bone]
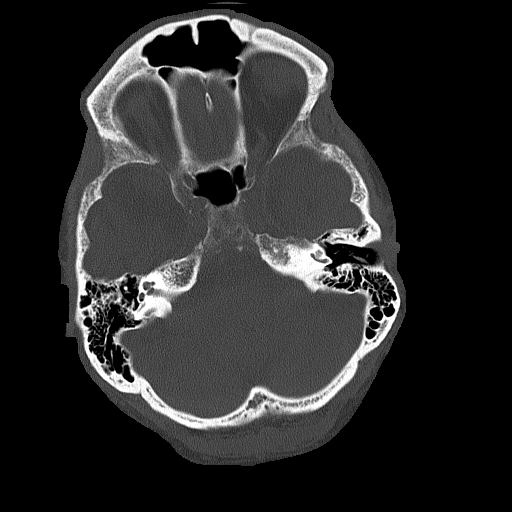
[im 26/84  bone]
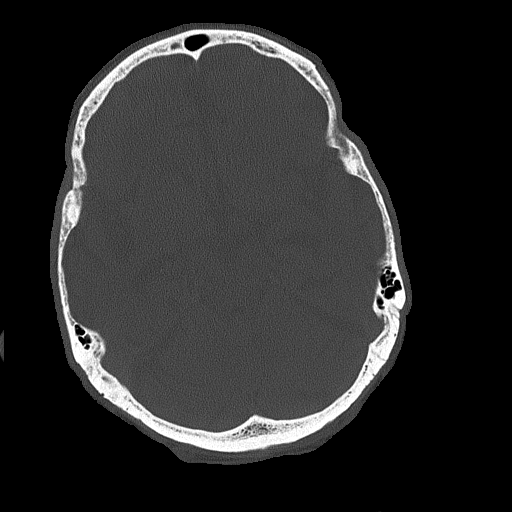
[im 37/84  bone]
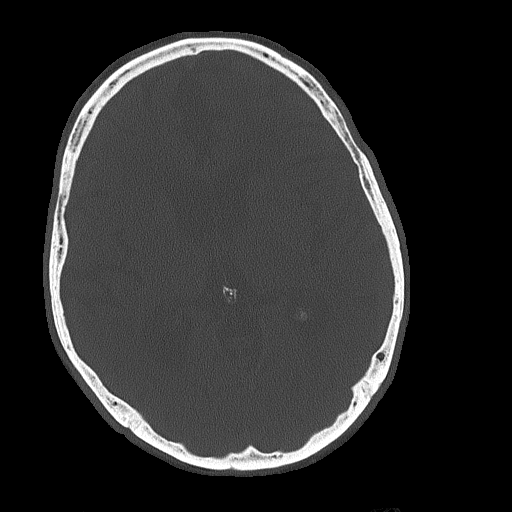
[im 47/84  bone]
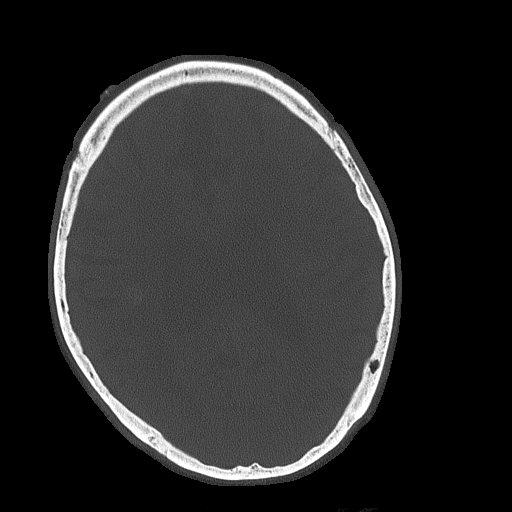
[im 58/84  bone]
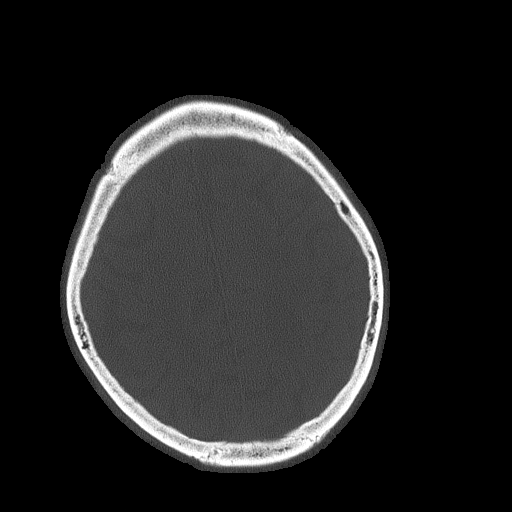
[im 68/84  bone]
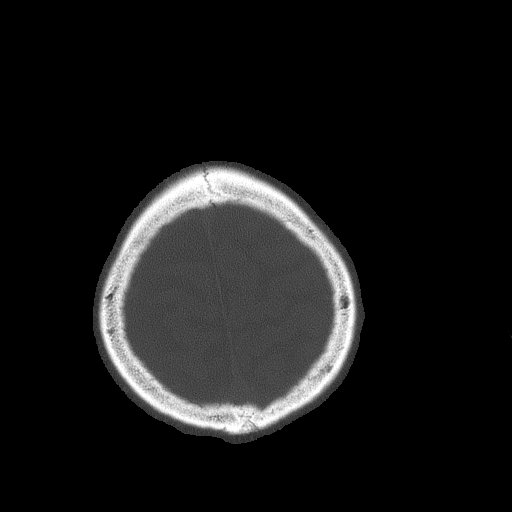
[im 78/84  bone]
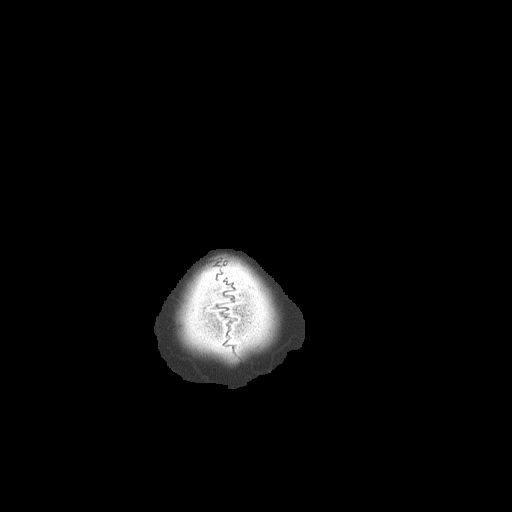

[Series 4: head wo · axial · 0.42mm/px · z∈[-125,-25]mm · 4 of 34 slices shown, 5 images]
[im 7/34  brain]
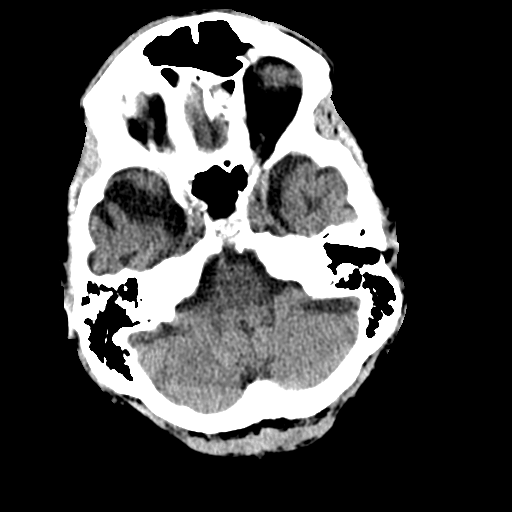
[im 7/34  bone]
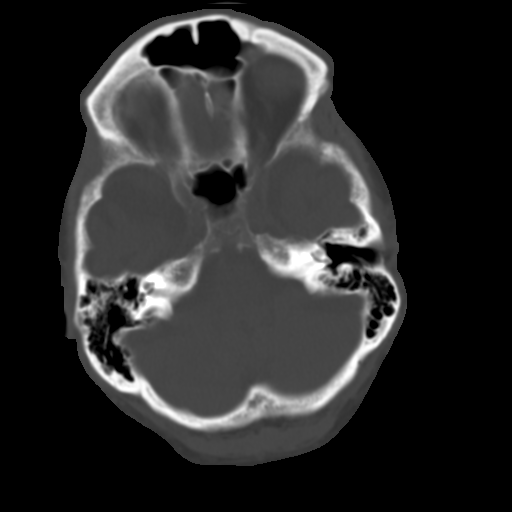
[im 14/34  brain]
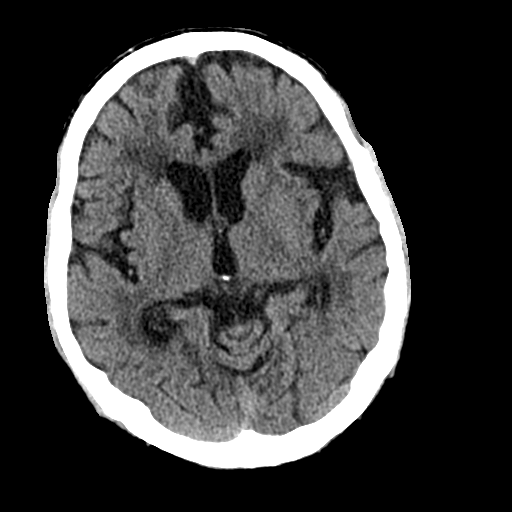
[im 20/34  brain]
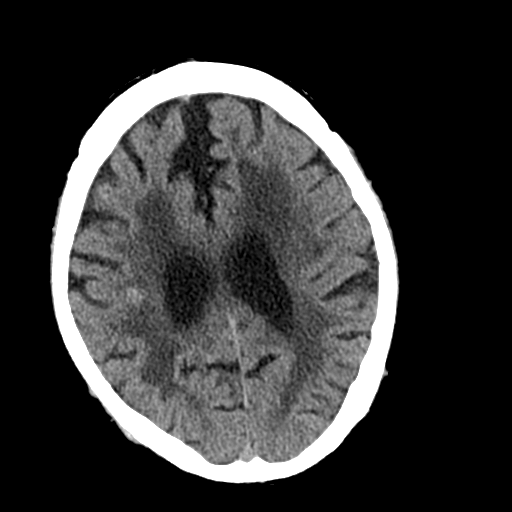
[im 27/34  brain]
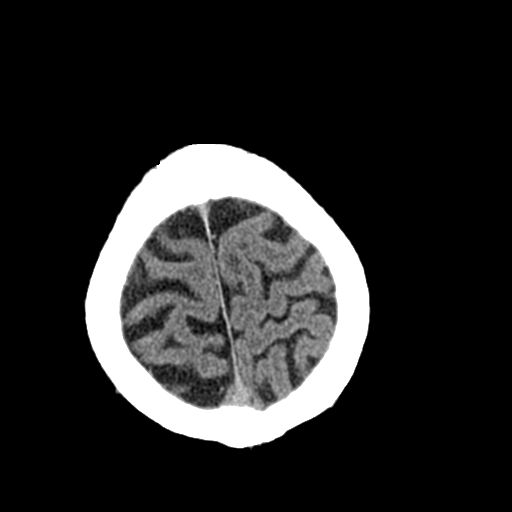

[Series 5: coronal soft tissue · coronal · 0.33mm/px · 3 of 69 slices shown]
[im 18/69  brain]
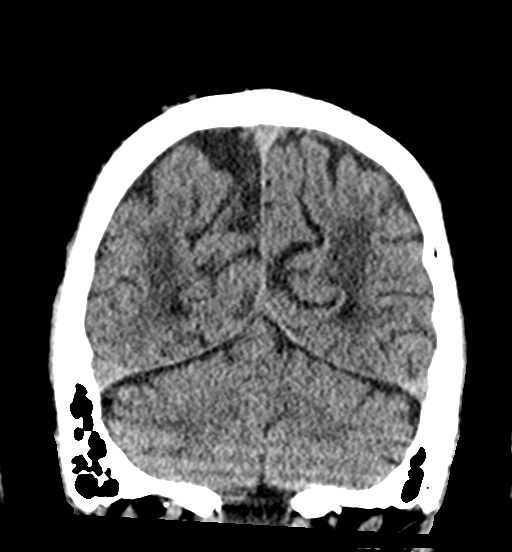
[im 35/69  brain]
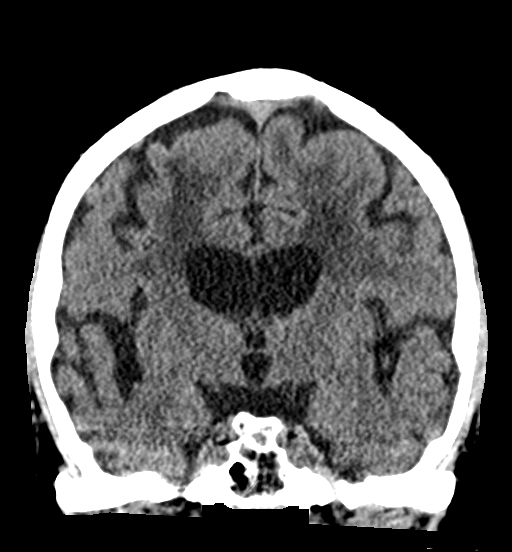
[im 52/69  brain]
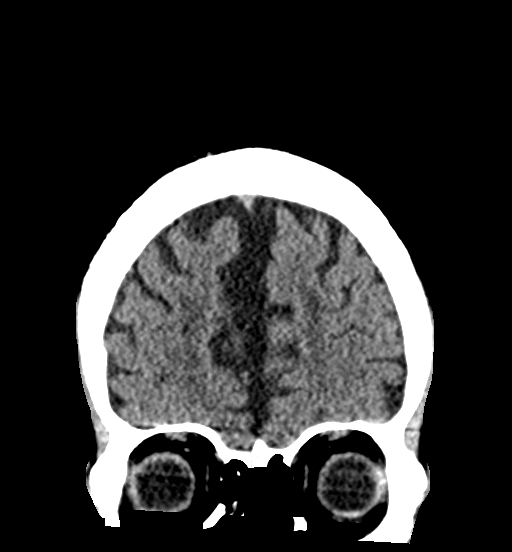

[Series 6: sagittal soft tissue · sagittal · 0.35mm/px · 2 of 57 slices shown]
[im 19/57  brain]
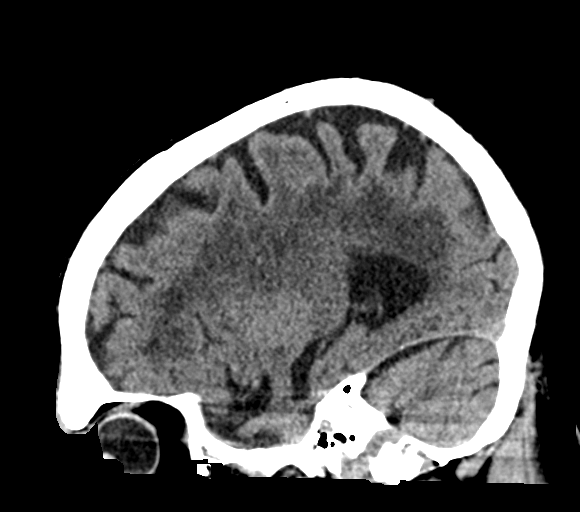
[im 38/57  brain]
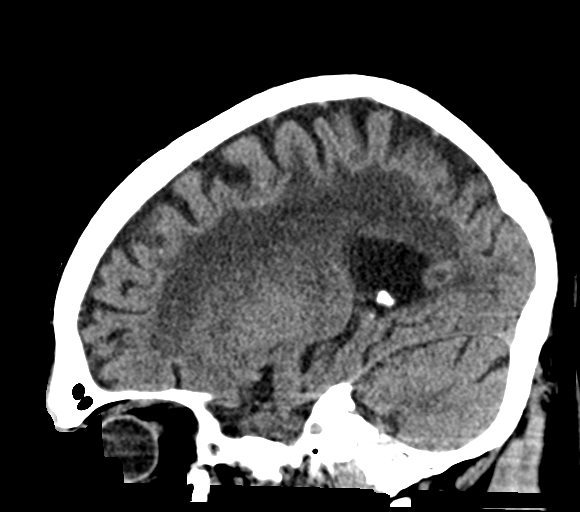

[17 of 47 positions shown; findings below may reference images not displayed]

FINDINGS: Brain: No acute intracranial hemorrhage, mass effect, or herniation.
No extra-axial fluid collections. No evidence of acute territorial
infarct. No hydrocephalus. Moderate cortical volume loss. Extensive
hypodensities throughout the periventricular and subcortical white
matter, likely secondary to chronic microvascular ischemic changes.
Chronic round 8 mm hyperdense focus in the right corona radiata,
measured 7 mm on previous study.

Vascular: No hyperdense vessel or unexpected calcification.

Skull: Normal. Negative for fracture or focal lesion.

Sinuses/Orbits: Small fluid level partially seen in the left
maxillary sinus. Mild opacification in the left mastoid air cells.

Other: None.
IMPRESSION: 1. No acute intracranial process identified.
2. Chronic microvascular ischemic changes, cortical volume loss and
chronic focal hyperdensity in the right corona radiata.
3. Correlate for possible acute left maxillary sinusitis. Mild
opacification in the left mastoid air cells.

## 2022-10-17 IMAGING — DX DG CHEST 1V PORT
1 series · 1 of 1 positions shown · non-contrast
Comparison: None.

CLINICAL DATA: Change in mental status

EXAM:
PORTABLE CHEST 1 VIEW

[chest ap]
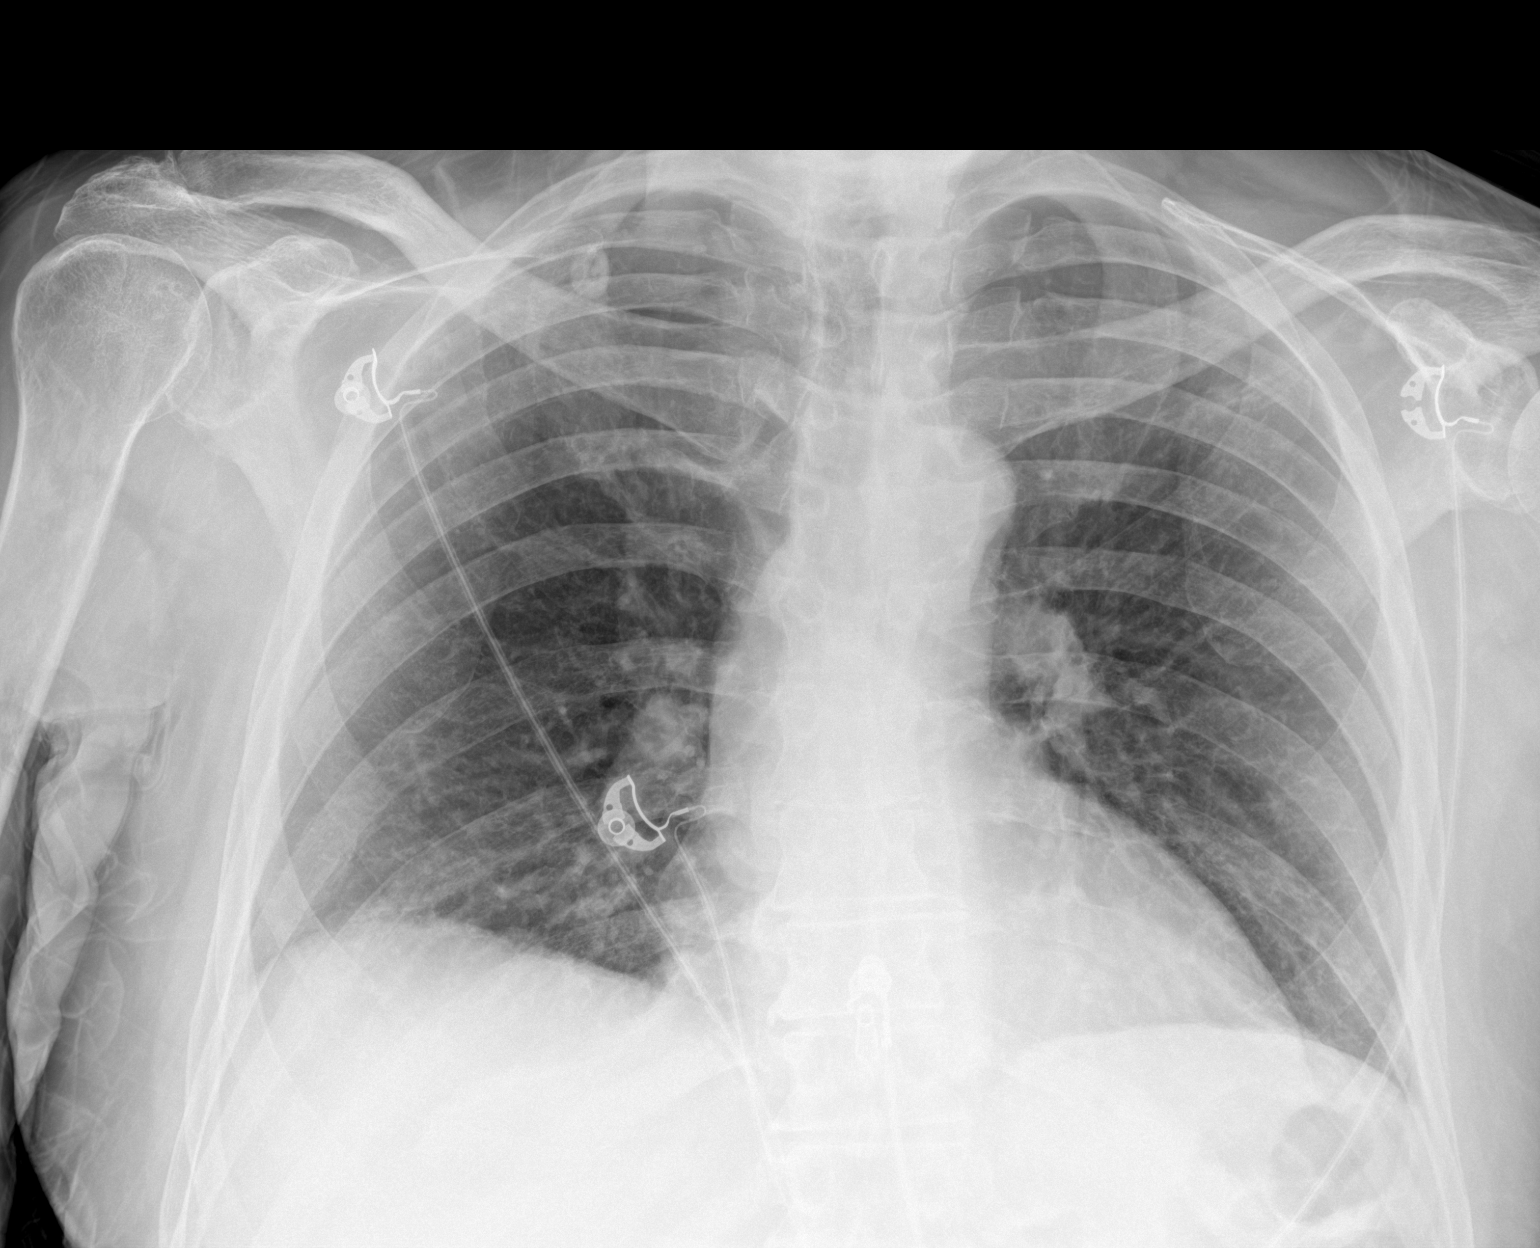

[1 of 1 positions shown; findings below may reference images not displayed]

FINDINGS: Approximately 1.5 cm oval opacity at the right lung apex. Otherwise,
no consolidation. No visible pleural effusions or pneumothorax.
Cardiomediastinal silhouette is within normal limits.
IMPRESSION: Approximately 1.5 cm oval opacity at the right lung apex. Recommend
CT chest to exclude pulmonary nodule/malignancy.
